# Patient Record
Sex: Male | Born: 1955 | Race: White | Hispanic: No | Marital: Married | Smoking: Current every day smoker
Health system: Southern US, Community
[De-identification: ages and names within clinical notes are randomized; demographics above are authoritative.]

## PROBLEM LIST (undated history)

## (undated) DIAGNOSIS — I10 Essential (primary) hypertension: Secondary | ICD-10-CM

---

## 2015-09-13 ENCOUNTER — Observation Stay: Payer: Self-pay

## 2015-09-13 ENCOUNTER — Emergency Department: Payer: Self-pay

## 2015-09-13 ENCOUNTER — Observation Stay
Admission: EM | Admit: 2015-09-13 | Discharge: 2015-09-14 | Disposition: A | Discharge status: Home / Self Care | Payer: Self-pay | Attending: Internal Medicine | Admitting: Internal Medicine

## 2015-09-13 ENCOUNTER — Inpatient Hospital Stay: Payer: Self-pay

## 2015-09-13 ENCOUNTER — Encounter: Payer: Self-pay | Admitting: Internal Medicine

## 2015-09-13 DIAGNOSIS — I440 Atrioventricular block, first degree: Secondary | ICD-10-CM | POA: Insufficient documentation

## 2015-09-13 DIAGNOSIS — K529 Noninfective gastroenteritis and colitis, unspecified: Secondary | ICD-10-CM | POA: Insufficient documentation

## 2015-09-13 DIAGNOSIS — K5900 Constipation, unspecified: Secondary | ICD-10-CM | POA: Insufficient documentation

## 2015-09-13 DIAGNOSIS — J90 Pleural effusion, not elsewhere classified: Secondary | ICD-10-CM | POA: Insufficient documentation

## 2015-09-13 DIAGNOSIS — I161 Hypertensive emergency: Secondary | ICD-10-CM

## 2015-09-13 DIAGNOSIS — M549 Dorsalgia, unspecified: Secondary | ICD-10-CM

## 2015-09-13 DIAGNOSIS — F17200 Nicotine dependence, unspecified, uncomplicated: Secondary | ICD-10-CM | POA: Insufficient documentation

## 2015-09-13 DIAGNOSIS — I517 Cardiomegaly: Secondary | ICD-10-CM | POA: Insufficient documentation

## 2015-09-13 DIAGNOSIS — I160 Hypertensive urgency: Principal | ICD-10-CM | POA: Diagnosis present

## 2015-09-13 DIAGNOSIS — Z8249 Family history of ischemic heart disease and other diseases of the circulatory system: Secondary | ICD-10-CM | POA: Insufficient documentation

## 2015-09-13 DIAGNOSIS — R7989 Other specified abnormal findings of blood chemistry: Secondary | ICD-10-CM | POA: Insufficient documentation

## 2015-09-13 DIAGNOSIS — R778 Other specified abnormalities of plasma proteins: Secondary | ICD-10-CM | POA: Insufficient documentation

## 2015-09-13 DIAGNOSIS — I4581 Long QT syndrome: Secondary | ICD-10-CM | POA: Insufficient documentation

## 2015-09-13 HISTORY — DX: Essential (primary) hypertension: I10

## 2015-09-13 LAB — COMPREHENSIVE METABOLIC PANEL
ALBUMIN: 3.9 g/dL (ref 3.5–5.0)
ALK PHOS: 80 U/L (ref 38–126)
ALT: 15 U/L — ABNORMAL LOW (ref 17–63)
ANION GAP: 6 (ref 5–15)
AST: 19 U/L (ref 15–41)
BILIRUBIN TOTAL: 1.2 mg/dL (ref 0.3–1.2)
BUN: 23 mg/dL — AB (ref 6–20)
CALCIUM: 8.9 mg/dL (ref 8.9–10.3)
CO2: 22 mmol/L (ref 22–32)
Chloride: 106 mmol/L (ref 101–111)
Creatinine, Ser: 1 mg/dL (ref 0.61–1.24)
GFR calc Af Amer: >60 mL/min (ref >60)
GFR calc non Af Amer: >60 mL/min (ref >60)
GLUCOSE: 106 mg/dL — AB (ref 65–99)
POTASSIUM: 3.3 mmol/L — AB (ref 3.5–5.1)
SODIUM: 134 mmol/L — AB (ref 135–145)
Total Protein: 6.9 g/dL (ref 6.5–8.1)

## 2015-09-13 LAB — CBC
HEMATOCRIT: 29.6 % — AB (ref 40.0–52.0)
HEMOGLOBIN: 9.6 g/dL — AB (ref 13.0–18.0)
MCH: 22.9 pg — ABNORMAL LOW (ref 26.0–34.0)
MCHC: 32.4 g/dL (ref 32.0–36.0)
MCV: 70.6 fL — ABNORMAL LOW (ref 80.0–100.0)
Platelets: 395 10*3/uL (ref 150–440)
RBC: 4.2 MIL/uL — ABNORMAL LOW (ref 4.40–5.90)
RDW: 15.9 % — ABNORMAL HIGH (ref 11.5–14.5)
WBC: 8.9 10*3/uL (ref 3.8–10.6)

## 2015-09-13 LAB — TROPONIN I
TROPONIN I: 0.1 ng/mL — AB (ref <0.031)
Troponin I: 0.12 ng/mL — ABNORMAL HIGH (ref <0.031)
Troponin I: 0.06 ng/mL — ABNORMAL HIGH (ref <0.031)

## 2015-09-13 MED ORDER — LABETALOL HCL 5 MG/ML IV SOLN
10.0000 mg | Freq: Once | INTRAVENOUS | Status: AC
  Administered 2015-09-13: 10 mg via INTRAVENOUS
  Filled 2015-09-13: qty 4

## 2015-09-13 MED ORDER — ONDANSETRON HCL 4 MG PO TABS: 4.0000 mg | ORAL_TABLET | Freq: Four times a day (QID) | ORAL | Status: DC | PRN

## 2015-09-13 MED ORDER — AMLODIPINE BESYLATE 5 MG PO TABS
5.0000 mg | ORAL_TABLET | Freq: Every day | ORAL | Status: DC
  Administered 2015-09-13 – 2015-09-14 (×2): 5 mg via ORAL
  Filled 2015-09-13 (×2): qty 1

## 2015-09-13 MED ORDER — ONDANSETRON HCL 4 MG/2ML IJ SOLN
4.0000 mg | Freq: Four times a day (QID) | INTRAMUSCULAR | Status: DC | PRN
  Administered 2015-09-13 – 2015-09-14 (×2): 4 mg via INTRAVENOUS
  Filled 2015-09-13 (×2): qty 2

## 2015-09-13 MED ORDER — POLYETHYLENE GLYCOL 3350 17 G PO PACK: 17.0000 g | PACK | Freq: Every day | ORAL | Status: DC | PRN

## 2015-09-13 MED ORDER — ACETAMINOPHEN 325 MG PO TABS: 650.0000 mg | ORAL_TABLET | Freq: Four times a day (QID) | ORAL | Status: DC | PRN

## 2015-09-13 MED ORDER — OXYCODONE HCL 5 MG PO TABS: 5.0000 mg | ORAL_TABLET | ORAL | Status: DC | PRN

## 2015-09-13 MED ORDER — CIPROFLOXACIN IN D5W 400 MG/200ML IV SOLN
400.0000 mg | Freq: Two times a day (BID) | INTRAVENOUS | Status: DC
  Administered 2015-09-13 – 2015-09-14 (×3): 400 mg via INTRAVENOUS
  Filled 2015-09-13 (×5): qty 200

## 2015-09-13 MED ORDER — SODIUM CHLORIDE 0.9% FLUSH
3.0000 mL | Freq: Two times a day (BID) | INTRAVENOUS | Status: DC
  Administered 2015-09-13 – 2015-09-14 (×3): 3 mL via INTRAVENOUS

## 2015-09-13 MED ORDER — ACETAMINOPHEN 650 MG RE SUPP: 650.0000 mg | Freq: Four times a day (QID) | RECTAL | Status: DC | PRN

## 2015-09-13 MED ORDER — METRONIDAZOLE IN NACL 5-0.79 MG/ML-% IV SOLN
500.0000 mg | Freq: Three times a day (TID) | INTRAVENOUS | Status: DC
  Administered 2015-09-13 – 2015-09-14 (×4): 500 mg via INTRAVENOUS
  Filled 2015-09-13 (×7): qty 100

## 2015-09-13 MED ORDER — HYDRALAZINE HCL 20 MG/ML IJ SOLN
10.0000 mg | INTRAMUSCULAR | Status: DC | PRN
  Administered 2015-09-13: 10 mg via INTRAVENOUS
  Filled 2015-09-13: qty 1

## 2015-09-13 MED ORDER — MORPHINE SULFATE (PF) 2 MG/ML IV SOLN: 2.0000 mg | INTRAVENOUS | Status: DC | PRN

## 2015-09-13 MED ORDER — DOCUSATE SODIUM 100 MG PO CAPS
100.0000 mg | ORAL_CAPSULE | Freq: Two times a day (BID) | ORAL | Status: DC
  Administered 2015-09-13 – 2015-09-14 (×2): 100 mg via ORAL
  Filled 2015-09-13 (×3): qty 1

## 2015-09-13 MED ORDER — ENOXAPARIN SODIUM 40 MG/0.4ML ~~LOC~~ SOLN
40.0000 mg | SUBCUTANEOUS | Status: DC
  Administered 2015-09-13 – 2015-09-14 (×2): 40 mg via SUBCUTANEOUS
  Filled 2015-09-13 (×2): qty 0.4

## 2015-09-13 MED ORDER — IOPAMIDOL (ISOVUE-370) INJECTION 76%
100.0000 mL | Freq: Once | INTRAVENOUS | Status: AC | PRN
  Administered 2015-09-13: 100 mL via INTRAVENOUS

## 2015-09-13 MED ORDER — NITROGLYCERIN 2 % TD OINT
1.0000 [in_us] | TOPICAL_OINTMENT | Freq: Four times a day (QID) | TRANSDERMAL | Status: DC
  Administered 2015-09-13 – 2015-09-14 (×6): 1 [in_us] via TOPICAL
  Filled 2015-09-13 (×6): qty 1

## 2015-09-13 MED ORDER — NICOTINE 21 MG/24HR TD PT24
21.0000 mg | MEDICATED_PATCH | Freq: Every day | TRANSDERMAL | Status: DC
  Administered 2015-09-13 – 2015-09-14 (×2): 21 mg via TRANSDERMAL
  Filled 2015-09-13 (×2): qty 1

## 2015-09-13 NOTE — ED Notes (Signed)
Pt transported to CT via stretcher. Wife left and will return later in the day.

## 2015-09-13 NOTE — ED Notes (Signed)
Pt states lower back "pressure", states normally has a BM every day but the last week or so he has not been having a BM. Pt states he was having "hot/cold sweats while trying to sleep" Pt states last BM was Sunday after taking a laxative.

## 2015-09-13 NOTE — H&P (Signed)
[facility] - [place] at [facility]   PATIENT NAME: Jose Riley    MR#:  [id number]  DATE OF BIRTH:  [birth year]   DATE OF ADMISSION:  09/13/2015  PRIMARY CARE PHYSICIAN: No PCP Per Patient   REQUESTING/REFERRING PHYSICIAN: brown  CHIEF COMPLAINT:   Back pain, constipation  HISTORY OF PRESENT ILLNESS:  Jose Riley  is a 60 y.o. male with a known history of essential hypertension on no medications secondary to insurance issues presenting to the hospital with back pain described as pressure in intensity 6/10 nonradiating and no worsening or relieving factors. Associated constipation last bowel movement 4-5 days ago. Subjective fevers chills no further symptomatology. Upon arrival to the emergency department noted to be markedly hypertensive systolic blood pressure greater than 200.  PAST MEDICAL HISTORY:   Past Medical History  Diagnosis Date  . Hypertension     PAST SURGICAL HISTORY:  History reviewed. No pertinent past surgical history.  SOCIAL HISTORY:   Social History  Substance Use Topics  . Smoking status: Current Every Day Smoker  . Smokeless tobacco: Not on file  . Alcohol Use: Yes    FAMILY HISTORY:   Family History  Problem Relation Age of Onset  . Hypertension Other     DRUG ALLERGIES:  No Known Allergies  REVIEW OF SYSTEMS:  REVIEW OF SYSTEMS:  CONSTITUTIONAL: Positive fevers, chills, fatigue, weakness.  EYES: Denies blurred vision, double vision, or eye pain.  EARS, NOSE, THROAT: Denies tinnitus, ear pain, hearing loss.  RESPIRATORY: denies cough, shortness of breath, wheezing  CARDIOVASCULAR: Denies chest pain, palpitations, edema.  GASTROINTESTINAL: Denies nausea, vomiting, diarrhea, positive abdominal pain.  GENITOURINARY: Denies dysuria, hematuria.  ENDOCRINE: Denies nocturia or thyroid problems. HEMATOLOGIC AND LYMPHATIC: Denies easy bruising or bleeding.  SKIN: Denies rash or lesions.  MUSCULOSKELETAL: Denies  pain in neck, back, shoulder, knees, hips, or further arthritic symptoms.  NEUROLOGIC: Denies paralysis, paresthesias.  PSYCHIATRIC: Denies anxiety or depressive symptoms. Otherwise full review of systems performed by me is negative.   MEDICATIONS AT HOME:   Prior to Admission medications   Not on File      VITAL SIGNS:  Blood pressure 179/130, pulse 80, temperature 98.7 F (37.1 C), temperature source Oral, resp. rate 23, height  (1.854 m), weight 83.915 kg (185 lb), SpO2 94 %.  PHYSICAL EXAMINATION:  VITAL SIGNS: Filed Vitals:   09/13/15 0730 09/13/15 0800  BP: 176/123 179/130  Pulse: 77 80  Temp:    Resp: 26 23   GENERAL:59 y.o.male currently in no acute distress.  HEAD: Normocephalic, atraumatic.  EYES: Pupils equal, round, reactive to light. Extraocular muscles intact. No scleral icterus.  MOUTH: Moist mucosal membrane. Dentition intact. No abscess noted.  EAR, NOSE, THROAT: Clear without exudates. No external lesions.  NECK: Supple. No thyromegaly. No nodules. No JVD.  PULMONARY: Clear to ascultation, without wheeze rails or rhonci. No use of accessory muscles, Good respiratory effort. good air entry bilaterally CHEST: Nontender to palpation.  CARDIOVASCULAR: S1 and S2. Regular rate and rhythm. No murmurs, rubs, or gallops. No edema. Pedal pulses 2+ bilaterally.  GASTROINTESTINAL: Soft, nontender, nondistended. No masses. Positive bowel sounds. No hepatosplenomegaly.  MUSCULOSKELETAL: No swelling, clubbing, or edema. Range of motion full in all extremities.  NEUROLOGIC: Cranial nerves II through XII are intact. No gross focal neurological deficits. Sensation intact. Reflexes intact.  SKIN: No ulceration, lesions, rashes, or cyanosis. Skin warm and dry. Turgor intact.  PSYCHIATRIC: Mood, affect within normal limits. The patient is  awake, alert and oriented x 3. Insight, judgment intact.    LABORATORY PANEL:   CBC  Recent Labs Lab 09/13/15 0639  WBC 8.9  HGB  9.6*  HCT 29.6*  PLT 395   ------------------------------------------------------------------------------------------------------------------  Chemistries   Recent Labs Lab 09/13/15 0639  NA 134*  K 3.3*  CL 106  CO2 22  GLUCOSE 106*  BUN 23*  CREATININE 1.00  CALCIUM 8.9  AST 19  ALT 15*  ALKPHOS 80  BILITOT 1.2   ------------------------------------------------------------------------------------------------------------------  Cardiac Enzymes  Recent Labs Lab 09/13/15 0639  TROPONINI 0.12*   ------------------------------------------------------------------------------------------------------------------  RADIOLOGY:  Dg Abd 1 View  09/13/2015  CLINICAL DATA:  Chronic constipation, with lower back pressure. Initial encounter. EXAM: ABDOMEN - 1 VIEW COMPARISON:  None. FINDINGS: The visualized bowel gas pattern is unremarkable. Scattered air and stool filled loops of colon are seen; no abnormal dilatation of small bowel loops is seen to suggest small bowel obstruction. No free intra-abdominal air is identified, though evaluation for free air is limited on a single supine view. The visualized osseous structures are within normal limits; the sacroiliac joints are unremarkable in appearance. IMPRESSION: Unremarkable bowel gas pattern; no free intra-abdominal air seen. Small to moderate amount of stool noted in the colon. Electronically Signed   By: Roanna Raider M.D.   On: 09/13/2015 03:06   Ct Angio Abd/pel W/ And/or W/o  09/13/2015  CLINICAL DATA:  Acute onset of lower back pressure. Lack of bowel movements. Severely elevated blood pressure. Initial encounter. EXAM: CTA ABDOMEN AND PELVIS wITHOUT AND WITH CONTRAST TECHNIQUE: Multidetector CT imaging of the abdomen and pelvis was performed using the standard protocol during bolus administration of intravenous contrast. Multiplanar reconstructed images and MIPs were obtained and reviewed to evaluate the vascular anatomy.  CONTRAST:  100 mL of Omnipaque 300 IV contrast COMPARISON:  Abdominal radiograph performed earlier today at 2:27 a.m. FINDINGS: Mildly loculated small bilateral pleural effusions are seen, left greater than right, with mild interstitial prominence and mild left basilar opacity. This may reflect mild pulmonary edema. The heart is enlarged, with biatrial enlargement. There is no evidence of aortic dissection. There is no evidence of aneurysmal dilatation. The celiac trunk, superior mesenteric artery, bilateral renal arteries and inferior mesenteric artery appear patent. The liver and spleen are unremarkable in appearance. The gallbladder is within normal limits. The pancreas and adrenal glands are unremarkable. The kidneys are unremarkable in appearance. There is no evidence of hydronephrosis. No renal or ureteral stones are seen. Mild nonspecific perinephric stranding is noted bilaterally. No free fluid is identified. The small bowel is unremarkable in appearance. The stomach is within normal limits. No acute vascular abnormalities are seen. There is ectasia of the infrarenal abdominal aorta, measuring 2.8 cm in AP dimension, without evidence of aneurysmal dilatation. Mild calcification is noted along the abdominal aorta and its branches. The appendix is normal in caliber, without evidence of appendicitis. Mild wall thickening is suggested along the descending and proximal sigmoid colon, concerning for mild infectious or inflammatory colitis. The bladder is mildly distended and grossly unremarkable. The prostate is borderline normal in size. No inguinal lymphadenopathy is seen. No acute osseous abnormalities are identified. Anterior osteophytes are noted along the lower thoracic and lumbar spine. Review of the MIP images confirms the above findings. IMPRESSION: 1. Mild wall thickening suggest along the descending and proximal sigmoid colon, concerning for mild infectious or inflammatory colitis. 2. Mildly loculated  small bilateral pleural effusions, left greater than right, with mild interstitial prominence  and mild left basilar opacity. This is concerning for mild pulmonary edema. 3. Cardiomegaly, with biatrial enlargement. 4. No evidence of aortic dissection. No evidence of aneurysmal dilatation. Ectasia of the infrarenal abdominal aorta. Mild calcification along the abdominal aorta and its branches. Electronically Signed   By: Roanna RaiderJeffery  Chang M.D.   On: 09/13/2015 07:30    EKG:   Orders placed or performed during the hospital encounter of 09/13/15  . EKG 12-Lead  . EKG 12-Lead    IMPRESSION AND PLAN:   60 year old Caucasian gentleman history of essential hypertension not on current medications secondary to insurance issues admitted 09/13/2015 with colitis and hypertensive urgency  1. Hypertensive urgency: Will start Norvasc,, as needed hydralazine, will place nitroglycerin paste 2. Colitis: Unspecified, we'll start Cipro Flagyl for about a coverage can transition to oral 1 tolerates oral diet 3. Elevated troponin likely setting of demand ischemia: Telemetry trend cardiac enzymes 4. Venous thrombus embolism prophylactic: Lovenox    All the records are reviewed and case discussed with ED provider. Management plans discussed with the patient, family and they are in agreement.  CODE STATUS: Full  TOTAL TIME TAKING CARE OF THIS PATIENT: 33 minutes.    Hisae Decoursey,  Mardi MainlandDavid K M.D on 09/13/2015 at 9:50 AM  Between 7am to 6pm - Pager - [phone]  After 6pm: House Pager: [phone]  [facility]  Office  [phone]  CC: Primary care physician; No PCP Per Patient

## 2015-09-13 NOTE — ED Provider Notes (Signed)
[facility] Emergency Department Provider Note  ____________________________________________  Time seen: 5:45 AM  I have reviewed the triage vital signs and the nursing notes.   HISTORY  Chief Complaint Constipation      HPI Jose Riley is a 60 y.o. male with no past medical history presents to the emergency department with constipation 4 days as well as low back pain. Patient states that he normally goes to the bathroom approximately 2-3 times a day that he does not have an issue with constipation however starting last week he's been constipated. Patient states that he took a laxative on Sunday and had a normal bowel movement. Patient however states that he has not had a bowel movement since that point patient denies any fever. Patient noted to be markedly hypertensive 207/163 and arrival to the treatment room.     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   Past surgical history Atrial septal defect repair No current outpatient prescriptions on file.  Allergies No known drug allergies No family history on file.  Social History Social History  Substance Use Topics  . Smoking status: Not on file  . Smokeless tobacco: Not on file  . Alcohol Use: Not on file    Review of Systems  Constitutional: Negative for fever. Eyes: Negative for visual changes. ENT: Negative for sore throat. Cardiovascular: Negative for chest pain. Respiratory: Negative for shortness of breath. Gastrointestinal: Negative for abdominal pain, vomiting and diarrhea. Genitourinary: Negative for dysuria. Musculoskeletal: Positive for back pain. Skin: Negative for rash. Neurological: Negative for headaches, focal weakness or numbness.  10-point ROS otherwise negative.  ____________________________________________   PHYSICAL EXAM:  VITAL SIGNS: ED Triage Vitals  Enc Vitals Group     BP 09/13/15  0203 165/100 mmHg     Pulse Rate 09/13/15 0203 100     Resp 09/13/15 0203 18     Temp 09/13/15 0203 98.7 F (37.1 C)     Temp Source 09/13/15 0203 Oral     SpO2 09/13/15 0203 97 %     Weight 09/13/15 0202 185 lb (83.915 kg)     Height 09/13/15 0202  (1.854 m)     Head Cir --      Peak Flow --      Pain Score 09/13/15 0202 2     Pain Loc --      Pain Edu? --      Excl. in GC? --     Constitutional: Alert and oriented. Well appearing and in no distress. Eyes: Conjunctivae are normal. PERRL. Normal extraocular movements. ENT   Head: Normocephalic and atraumatic.   Nose: No congestion/rhinnorhea.   Mouth/Throat: Mucous membranes are moist.   Neck: No stridor. Hematological/Lymphatic/Immunilogical: No cervical lymphadenopathy. Cardiovascular: Normal rate, regular rhythm. Normal and symmetric distal pulses are present in all extremities. No murmurs, rubs, or gallops. Respiratory: Normal respiratory effort without tachypnea nor retractions. Breath sounds are clear and equal bilaterally. No wheezes/rales/rhonchi. Gastrointestinal: Soft and nontender. No distention. There is no CVA tenderness. Genitourinary: deferred Musculoskeletal: Nontender with normal range of motion in all extremities. No joint effusions.  No lower extremity tenderness nor edema. Neurologic:  Normal speech and language. No gross focal neurologic deficits are appreciated. Speech is normal.  Skin:  Skin is warm, dry and intact. No rash noted. Psychiatric: Mood and affect are normal. Speech and behavior are normal. Patient exhibits appropriate insight and judgment.  ____________________________________________  LABS (pertinent positives/negatives)  Labs Reviewed  CBC - Abnormal; Notable for the following:    RBC 4.20 (*)    Hemoglobin 9.6 (*)    HCT 29.6 (*)    MCV 70.6 (*)    MCH 22.9 (*)    RDW 15.9 (*)    All other components within normal limits  COMPREHENSIVE METABOLIC PANEL -  Abnormal; Notable for the following:    Sodium 134 (*)    Potassium 3.3 (*)    Glucose, Bld 106 (*)    BUN 23 (*)    ALT 15 (*)    All other components within normal limits  TROPONIN I - Abnormal; Notable for the following:    Troponin I 0.12 (*)    All other components within normal limits  TROPONIN I - Abnormal; Notable for the following:    Troponin I 0.10 (*)    All other components within normal limits  TROPONIN I - Abnormal; Notable for the following:    Troponin I 0.06 (*)    All other components within normal limits  TROPONIN I - Abnormal; Notable for the following:    Troponin I 0.06 (*)    All other components within normal limits  BASIC METABOLIC PANEL - Abnormal; Notable for the following:    Sodium 134 (*)    Potassium 3.4 (*)    Glucose, Bld 102 (*)    Calcium 8.5 (*)    All other components within normal limits  CBC - Abnormal; Notable for the following:    RBC 3.72 (*)    Hemoglobin 8.5 (*)    HCT 26.8 (*)    MCV 71.9 (*)    MCH 22.7 (*)    MCHC 31.6 (*)    RDW 16.3 (*)    All other components within normal limits     ____________________________________________   EKG  ED ECG REPORT I, [place] [facility], the attending physician, personally viewed and interpreted this ECG.   Date: 09/14/2015  EKG Time: 7:22 AM  Rate: 77  Rhythm: Normal sinus rhythm with LVH  Axis: Left axis deviation  Intervals: Normal  ST&T Change: V5 V6 T-wave inversion   ____________________________________________    RADIOLOGY DG Chest 2 View (Final result) Result time: 09/13/15 10:06:24   Final result by Rad Results In Interface (09/13/15 10:06:24)   Narrative:   CLINICAL DATA: 60 year old male with severe hypertension, greater than 200 systolic. Pain radiating to the back. Initial encounter.  EXAM: CHEST 2 VIEW  COMPARISON: CTA abdomen and pelvis 0628 hours today.  FINDINGS: Cardiomegaly. Mildly tortuous thoracic aorta. Other mediastinal contours are  within normal limits. Visualized tracheal air column is within normal limits. Small left greater than right pleural effusions re- demonstrated. Increased interstitial markings in the chest, basilar predominant. No pneumothorax. No consolidation. Widespread bulky spinal endplate spurring. No acute osseous abnormality identified.  IMPRESSION: Cardiomegaly with small left greater than right pleural effusions and increased interstitial opacity suspicious for mild interstitial edema in this setting. Chronic interstitial pulmonary changes and viral/atypical infection are the main differential considerations.   Electronically Signed By: Odessa FlemingH Hall M.D. On: 09/13/2015 10:06          CT Angio Abd/Pel w/ and/or w/o (Final result) Result time: 09/13/15 07:30:27   Procedure changed from CT Abdomen Pelvis W Contrast      Final result by Rad Results In Interface (09/13/15 07:30:27)   Narrative:   CLINICAL DATA: Acute onset of lower back pressure. Lack of bowel movements. Severely  elevated blood pressure. Initial encounter.  EXAM: CTA ABDOMEN AND PELVIS wITHOUT AND WITH CONTRAST  TECHNIQUE: Multidetector CT imaging of the abdomen and pelvis was performed using the standard protocol during bolus administration of intravenous contrast. Multiplanar reconstructed images and MIPs were obtained and reviewed to evaluate the vascular anatomy.  CONTRAST: 100 mL of Omnipaque 300 IV contrast  COMPARISON: Abdominal radiograph performed earlier today at 2:27 a.m.  FINDINGS: Mildly loculated small bilateral pleural effusions are seen, left greater than right, with mild interstitial prominence and mild left basilar opacity. This may reflect mild pulmonary edema. The heart is enlarged, with biatrial enlargement.  There is no evidence of aortic dissection. There is no evidence of aneurysmal dilatation. The celiac trunk, superior mesenteric artery, bilateral renal arteries and inferior  mesenteric artery appear patent.  The liver and spleen are unremarkable in appearance. The gallbladder is within normal limits. The pancreas and adrenal glands are unremarkable.  The kidneys are unremarkable in appearance. There is no evidence of hydronephrosis. No renal or ureteral stones are seen. Mild nonspecific perinephric stranding is noted bilaterally.  No free fluid is identified. The small bowel is unremarkable in appearance. The stomach is within normal limits. No acute vascular abnormalities are seen. There is ectasia of the infrarenal abdominal aorta, measuring 2.8 cm in AP dimension, without evidence of aneurysmal dilatation. Mild calcification is noted along the abdominal aorta and its branches.  The appendix is normal in caliber, without evidence of appendicitis. Mild wall thickening is suggested along the descending and proximal sigmoid colon, concerning for mild infectious or inflammatory colitis.  The bladder is mildly distended and grossly unremarkable. The prostate is borderline normal in size. No inguinal lymphadenopathy is seen.  No acute osseous abnormalities are identified. Anterior osteophytes are noted along the lower thoracic and lumbar spine.  Review of the MIP images confirms the above findings.  IMPRESSION: 1. Mild wall thickening suggest along the descending and proximal sigmoid colon, concerning for mild infectious or inflammatory colitis. 2. Mildly loculated small bilateral pleural effusions, left greater than right, with mild interstitial prominence and mild left basilar opacity. This is concerning for mild pulmonary edema. 3. Cardiomegaly, with biatrial enlargement. 4. No evidence of aortic dissection. No evidence of aneurysmal dilatation. Ectasia of the infrarenal abdominal aorta. Mild calcification along the abdominal aorta and its branches.   Electronically Signed By: Roanna Raider M.D. On: 09/13/2015 07:30          DG  Abd 1 View (Final result) Result time: 09/13/15 03:06:58   Final result by Rad Results In Interface (09/13/15 03:06:58)   Narrative:   CLINICAL DATA: Chronic constipation, with lower back pressure. Initial encounter.  EXAM: ABDOMEN - 1 VIEW  COMPARISON: None.  FINDINGS: The visualized bowel gas pattern is unremarkable. Scattered air and stool filled loops of colon are seen; no abnormal dilatation of small bowel loops is seen to suggest small bowel obstruction. No free intra-abdominal air is identified, though evaluation for free air is limited on a single supine view.  The visualized osseous structures are within normal limits; the sacroiliac joints are unremarkable in appearance.  IMPRESSION: Unremarkable bowel gas pattern; no free intra-abdominal air seen. Small to moderate amount of stool noted in the colon.   Electronically Signed By: Roanna Raider M.D. On: 09/13/2015 03:06         INITIAL IMPRESSION / ASSESSMENT AND PLAN / ED COURSE  Pertinent labs & imaging results that were available during my care of the patient were reviewed by me  and considered in my medical decision making (see chart for details).  Patient markedly hypertensive on presentation the emergency department which resulted in before mentioned studies performed which revealed left ventricular hypertrophy elevated troponin concern for possible demand ischemia. As a result patient discussed with Dr. Betti Cruz for hospital admission for further evaluation and management  ____________________________________________   FINAL CLINICAL IMPRESSION(S) / ED DIAGNOSES  Final diagnoses:  Elevated troponin  LVH (left ventricular hypertrophy)  Hypertensive emergency      Darci Current, MD 09/14/15 [phone]

## 2015-09-13 NOTE — ED Notes (Signed)
Pt denies hx HTN. BP taken on both arms, elevated multiple times. Pt denies headache, blurred vision, dizziness.

## 2015-09-13 NOTE — Progress Notes (Signed)
bp 157/115 hydralizine 10 mg administer via iv, 4 mg zofran administer for nauisea

## 2015-09-13 NOTE — Progress Notes (Signed)
Patient admitted to unit from ed, to room 258, patient is 60 y/o male alert and oriented, denies pain or discomfort. Patient ambulate to bathroom independently, family at bedside. Admission assessment  Completed, NSR on the monitor, patient oriented to unit and call light

## 2015-09-13 NOTE — ED Notes (Signed)
Patient ambulatory to triage with steady gait, without difficulty or distress noted; pt reports tooks laxative Sunday pm for "chronic constipation" with relief; tonight having lower back "pressure" and urge to have BM; denies abd pain, denies nausea/vomiting

## 2015-09-14 LAB — CBC
HEMATOCRIT: 26.8 % — AB (ref 40.0–52.0)
HEMOGLOBIN: 8.5 g/dL — AB (ref 13.0–18.0)
MCH: 22.7 pg — AB (ref 26.0–34.0)
MCHC: 31.6 g/dL — AB (ref 32.0–36.0)
MCV: 71.9 fL — AB (ref 80.0–100.0)
Platelets: 320 10*3/uL (ref 150–440)
RBC: 3.72 MIL/uL — ABNORMAL LOW (ref 4.40–5.90)
RDW: 16.3 % — AB (ref 11.5–14.5)
WBC: 8 10*3/uL (ref 3.8–10.6)

## 2015-09-14 LAB — BASIC METABOLIC PANEL
ANION GAP: 7 (ref 5–15)
BUN: 18 mg/dL (ref 6–20)
CO2: 22 mmol/L (ref 22–32)
Calcium: 8.5 mg/dL — ABNORMAL LOW (ref 8.9–10.3)
Chloride: 105 mmol/L (ref 101–111)
Creatinine, Ser: 0.94 mg/dL (ref 0.61–1.24)
GFR calc Af Amer: >60 mL/min (ref >60)
GFR calc non Af Amer: >60 mL/min (ref >60)
GLUCOSE: 102 mg/dL — AB (ref 65–99)
POTASSIUM: 3.4 mmol/L — AB (ref 3.5–5.1)
Sodium: 134 mmol/L — ABNORMAL LOW (ref 135–145)

## 2015-09-14 LAB — TROPONIN I: TROPONIN I: 0.06 ng/mL — AB (ref <0.031)

## 2015-09-14 MED ORDER — AMLODIPINE BESYLATE 5 MG PO TABS: 5.0000 mg | ORAL_TABLET | Freq: Every day | ORAL | Status: DC

## 2015-09-14 MED ORDER — AMLODIPINE BESYLATE 5 MG PO TABS
5.0000 mg | ORAL_TABLET | Freq: Once | ORAL | Status: AC
  Administered 2015-09-14: 5 mg via ORAL
  Filled 2015-09-14: qty 1

## 2015-09-14 MED ORDER — AMLODIPINE BESYLATE 10 MG PO TABS: 10.0000 mg | ORAL_TABLET | Freq: Every day | ORAL | Status: DC

## 2015-09-14 MED ORDER — METRONIDAZOLE 500 MG PO TABS: 500.0000 mg | ORAL_TABLET | Freq: Three times a day (TID) | ORAL | Status: AC

## 2015-09-14 MED ORDER — CIPROFLOXACIN HCL 500 MG PO TABS: 500.0000 mg | ORAL_TABLET | Freq: Two times a day (BID) | ORAL | Status: AC

## 2015-09-14 NOTE — Care Management (Signed)
Patient placed in observation for blood pressure issues.  Patient does not have insurance.  [place] [facility]  Provided patient's wife with coupons for norvasc, and flagyl.  Cipro is on the [employer] four dollar list.  Wife says will be able to pay for meds and does not require referral to [facility]

## 2015-09-14 NOTE — Progress Notes (Signed)
MD notified. PTs DBP is >100 after additional 5mg  of norvasc. Orders to proceed with discharge. Pt will discharge via wheelchair, family at side. Discharge information will be reviewed with patient.

## 2015-09-14 NOTE — Discharge Summary (Addendum)
[facility] - [place] at [facility]   PATIENT NAME: Jose Riley    MR#:  [id number]  DATE OF BIRTH:  07-18-1955  DATE OF ADMISSION:  09/13/2015 ADMITTING PHYSICIAN: Wyatt Haste, MD  DATE OF DISCHARGE: 09/14/2015  PRIMARY CARE PHYSICIAN: No PCP Per Patient    ADMISSION DIAGNOSIS:  Back pain [M54.9] LVH (left ventricular hypertrophy) [I51.7] Elevated troponin [R79.89] Hypertensive emergency [I16.1]  DISCHARGE DIAGNOSIS:  Principal Problem:   Hypertensive urgency Colitis, unspecified Elevated troponin-demand  SECONDARY DIAGNOSIS:   Past Medical History  Diagnosis Date  . Hypertension     HOSPITAL COURSE:  Jose Riley  is a 60 y.o. male admitted 09/13/2015 with chief complaint Constipation . Please see H&P performed by Wyatt Haste, MD for further information. Patient presented with above symptoms found to be markedly hypertensive on arrival. Systolic blood pressure greater than 200. Has been started on new medications for blood pressure with improvement. On imaging he is noted to have evidence of colitis and started on Cipro Flagyl his "pressure" in his abdomen has also improved on day of discharge he was improved and stable  DISCHARGE CONDITIONS:   Stable/improved  CONSULTS OBTAINED:     DRUG ALLERGIES:  No Known Allergies  DISCHARGE MEDICATIONS:   Current Discharge Medication List    START taking these medications   Details  amLODipine (NORVASC) 10 MG tablet Take 1 tablet (10 mg total) by mouth daily. Qty: 30 tablet, Refills: 0    ciprofloxacin (CIPRO) 500 MG tablet Take 1 tablet (500 mg total) by mouth 2 (two) times daily. Qty: 8 tablet, Refills: 0    metroNIDAZOLE (FLAGYL) 500 MG tablet Take 1 tablet (500 mg total) by mouth 3 (three) times daily. Qty: 12 tablet, Refills: 0         DISCHARGE INSTRUCTIONS:    DIET:  Cardiac diet  DISCHARGE CONDITION:  Good  ACTIVITY:  Activity as tolerated  OXYGEN:  Home  Oxygen: No.   Oxygen Delivery: room air  DISCHARGE LOCATION:  home   If you experience worsening of your admission symptoms, develop shortness of breath, life threatening emergency, suicidal or homicidal thoughts you must seek medical attention immediately by calling 911 or calling your MD immediately  if symptoms less severe.  You Must read complete instructions/literature along with all the possible adverse reactions/side effects for all the Medicines you take and that have been prescribed to you. Take any new Medicines after you have completely understood and accpet all the possible adverse reactions/side effects.   Please note  You were cared for by a hospitalist during your hospital stay. If you have any questions about your discharge medications or the care you received while you were in the hospital after you are discharged, you can call the unit and asked to speak with the hospitalist on call if the hospitalist that took care of you is not available. Once you are discharged, your primary care physician will handle any further medical issues. Please note that NO REFILLS for any discharge medications will be authorized once you are discharged, as it is imperative that you return to your primary care physician (or establish a relationship with a primary care physician if you do not have one) for your aftercare needs so that they can reassess your need for medications and monitor your lab values.    On the day of Discharge:   VITAL SIGNS:  Blood pressure 126/109, pulse 79, temperature 98.4 F (36.9 C), temperature source Oral,  resp. rate 20, height 6\' 1"  (1.854 m), weight 82.736 kg (182 lb 6.4 oz), SpO2 97 %.  I/O:   Intake/Output Summary (Last 24 hours) at 09/14/15 1341 Last data filed at 09/14/15 0432  Gross per 24 hour  Intake      0 ml  Output      1 ml  Net     -1 ml    PHYSICAL EXAMINATION:  GENERAL:  60 y.o.-year-old patient lying in the bed with no acute distress.    EYES: Pupils equal, round, reactive to light and accommodation. No scleral icterus. Extraocular muscles intact.  HEENT: Head atraumatic, normocephalic. Oropharynx and nasopharynx clear.  NECK:  Supple, no jugular venous distention. No thyroid enlargement, no tenderness.  LUNGS: Normal breath sounds bilaterally, no wheezing, rales,rhonchi or crepitation. No use of accessory muscles of respiration.  CARDIOVASCULAR: S1, S2 normal. No murmurs, rubs, or gallops.  ABDOMEN: Soft, non-tender, non-distended. Bowel sounds present. No organomegaly or mass.  EXTREMITIES: No pedal edema, cyanosis, or clubbing.  NEUROLOGIC: Cranial nerves II through XII are intact. Muscle strength 5/5 in all extremities. Sensation intact. Gait not checked.  PSYCHIATRIC: The patient is alert and oriented x 3.  SKIN: No obvious rash, lesion, or ulcer.   DATA REVIEW:   CBC  Recent Labs Lab 09/14/15 0340  WBC 8.0  HGB 8.5*  HCT 26.8*  PLT 320    Chemistries   Recent Labs Lab 09/13/15 0639 09/14/15 0340  NA 134* 134*  K 3.3* 3.4*  CL 106 105  CO2 22 22  GLUCOSE 106* 102*  BUN 23* 18  CREATININE 1.00 0.94  CALCIUM 8.9 8.5*  AST 19  --   ALT 15*  --   ALKPHOS 80  --   BILITOT 1.2  --     Cardiac Enzymes  Recent Labs Lab 09/14/15 0340  TROPONINI 0.06*    Microbiology Results  No results found for this or any previous visit.  RADIOLOGY:  Dg Chest 2 View  09/13/2015  CLINICAL DATA:  60 year old male with severe hypertension, greater than 200 systolic. Pain radiating to the back. Initial encounter. EXAM: CHEST  2 VIEW COMPARISON:  CTA abdomen and pelvis 0628 hours today. FINDINGS: Cardiomegaly. Mildly tortuous thoracic aorta. Other mediastinal contours are within normal limits. Visualized tracheal air column is within normal limits. Small left greater than right pleural effusions re- demonstrated. Increased interstitial markings in the chest, basilar predominant. No pneumothorax. No consolidation.  Widespread bulky spinal endplate spurring. No acute osseous abnormality identified. IMPRESSION: Cardiomegaly with small left greater than right pleural effusions and increased interstitial opacity suspicious for mild interstitial edema in this setting. Chronic interstitial pulmonary changes and viral/atypical infection are the main differential considerations. Electronically Signed   By: Odessa FlemingH  Hall M.D.   On: 09/13/2015 10:06   Dg Abd 1 View  09/13/2015  CLINICAL DATA:  Chronic constipation, with lower back pressure. Initial encounter. EXAM: ABDOMEN - 1 VIEW COMPARISON:  None. FINDINGS: The visualized bowel gas pattern is unremarkable. Scattered air and stool filled loops of colon are seen; no abnormal dilatation of small bowel loops is seen to suggest small bowel obstruction. No free intra-abdominal air is identified, though evaluation for free air is limited on a single supine view. The visualized osseous structures are within normal limits; the sacroiliac joints are unremarkable in appearance. IMPRESSION: Unremarkable bowel gas pattern; no free intra-abdominal air seen. Small to moderate amount of stool noted in the colon. Electronically Signed   By: Roanna RaiderJeffery  Chang  M.D.   On: 09/13/2015 03:06   Ct Angio Abd/pel W/ And/or W/o  09/13/2015  CLINICAL DATA:  Acute onset of lower back pressure. Lack of bowel movements. Severely elevated blood pressure. Initial encounter. EXAM: CTA ABDOMEN AND PELVIS wITHOUT AND WITH CONTRAST TECHNIQUE: Multidetector CT imaging of the abdomen and pelvis was performed using the standard protocol during bolus administration of intravenous contrast. Multiplanar reconstructed images and MIPs were obtained and reviewed to evaluate the vascular anatomy. CONTRAST:  100 mL of Omnipaque 300 IV contrast COMPARISON:  Abdominal radiograph performed earlier today at 2:27 a.m. FINDINGS: Mildly loculated small bilateral pleural effusions are seen, left greater than right, with mild interstitial  prominence and mild left basilar opacity. This may reflect mild pulmonary edema. The heart is enlarged, with biatrial enlargement. There is no evidence of aortic dissection. There is no evidence of aneurysmal dilatation. The celiac trunk, superior mesenteric artery, bilateral renal arteries and inferior mesenteric artery appear patent. The liver and spleen are unremarkable in appearance. The gallbladder is within normal limits. The pancreas and adrenal glands are unremarkable. The kidneys are unremarkable in appearance. There is no evidence of hydronephrosis. No renal or ureteral stones are seen. Mild nonspecific perinephric stranding is noted bilaterally. No free fluid is identified. The small bowel is unremarkable in appearance. The stomach is within normal limits. No acute vascular abnormalities are seen. There is ectasia of the infrarenal abdominal aorta, measuring 2.8 cm in AP dimension, without evidence of aneurysmal dilatation. Mild calcification is noted along the abdominal aorta and its branches. The appendix is normal in caliber, without evidence of appendicitis. Mild wall thickening is suggested along the descending and proximal sigmoid colon, concerning for mild infectious or inflammatory colitis. The bladder is mildly distended and grossly unremarkable. The prostate is borderline normal in size. No inguinal lymphadenopathy is seen. No acute osseous abnormalities are identified. Anterior osteophytes are noted along the lower thoracic and lumbar spine. Review of the MIP images confirms the above findings. IMPRESSION: 1. Mild wall thickening suggest along the descending and proximal sigmoid colon, concerning for mild infectious or inflammatory colitis. 2. Mildly loculated small bilateral pleural effusions, left greater than right, with mild interstitial prominence and mild left basilar opacity. This is concerning for mild pulmonary edema. 3. Cardiomegaly, with biatrial enlargement. 4. No evidence of aortic  dissection. No evidence of aneurysmal dilatation. Ectasia of the infrarenal abdominal aorta. Mild calcification along the abdominal aorta and its branches. Electronically Signed   By: Roanna Raider M.D.   On: 09/13/2015 07:30     Management plans discussed with the patient, family and they are in agreement.  CODE STATUS:     Code Status Orders        Start     Ordered   09/13/15 0809  Full code   Continuous     09/13/15 0809    Code Status History    Date Active Date Inactive Code Status Order ID Comments User Context   This patient has a current code status but no historical code status.      TOTAL TIME TAKING CARE OF THIS PATIENT: 28 minutes.    Karisma Meiser,  Mardi [place].D on 09/14/2015 at 1:41 PM  Between 7am to 6pm - Pager - [phone]  After 6pm go to [email or web address] - password [email or web address] [facility]  [place] [facility]  Office  [phone]  CC: Primary care physician; No PCP Per Patient

## 2015-09-14 NOTE — Progress Notes (Signed)
Pts BP is still elevated. MD notified. Norvasc dose increased. Will monitor patient for 1 hour. Recheck BP and discharge patient home per MD order. I will continue to assess.

## 2015-10-08 MED ORDER — AMLODIPINE BESYLATE 10 MG PO TABS: 10.0000 mg | ORAL_TABLET | Freq: Every day | ORAL | Status: AC

## 2016-08-06 IMAGING — CR DG ABDOMEN 1V
1 series · 1 of 1 positions shown · non-contrast
Comparison: None.

CLINICAL DATA: Chronic constipation, with lower back pressure.
Initial encounter.

EXAM:
ABDOMEN - 1 VIEW

[t abdomen supine]
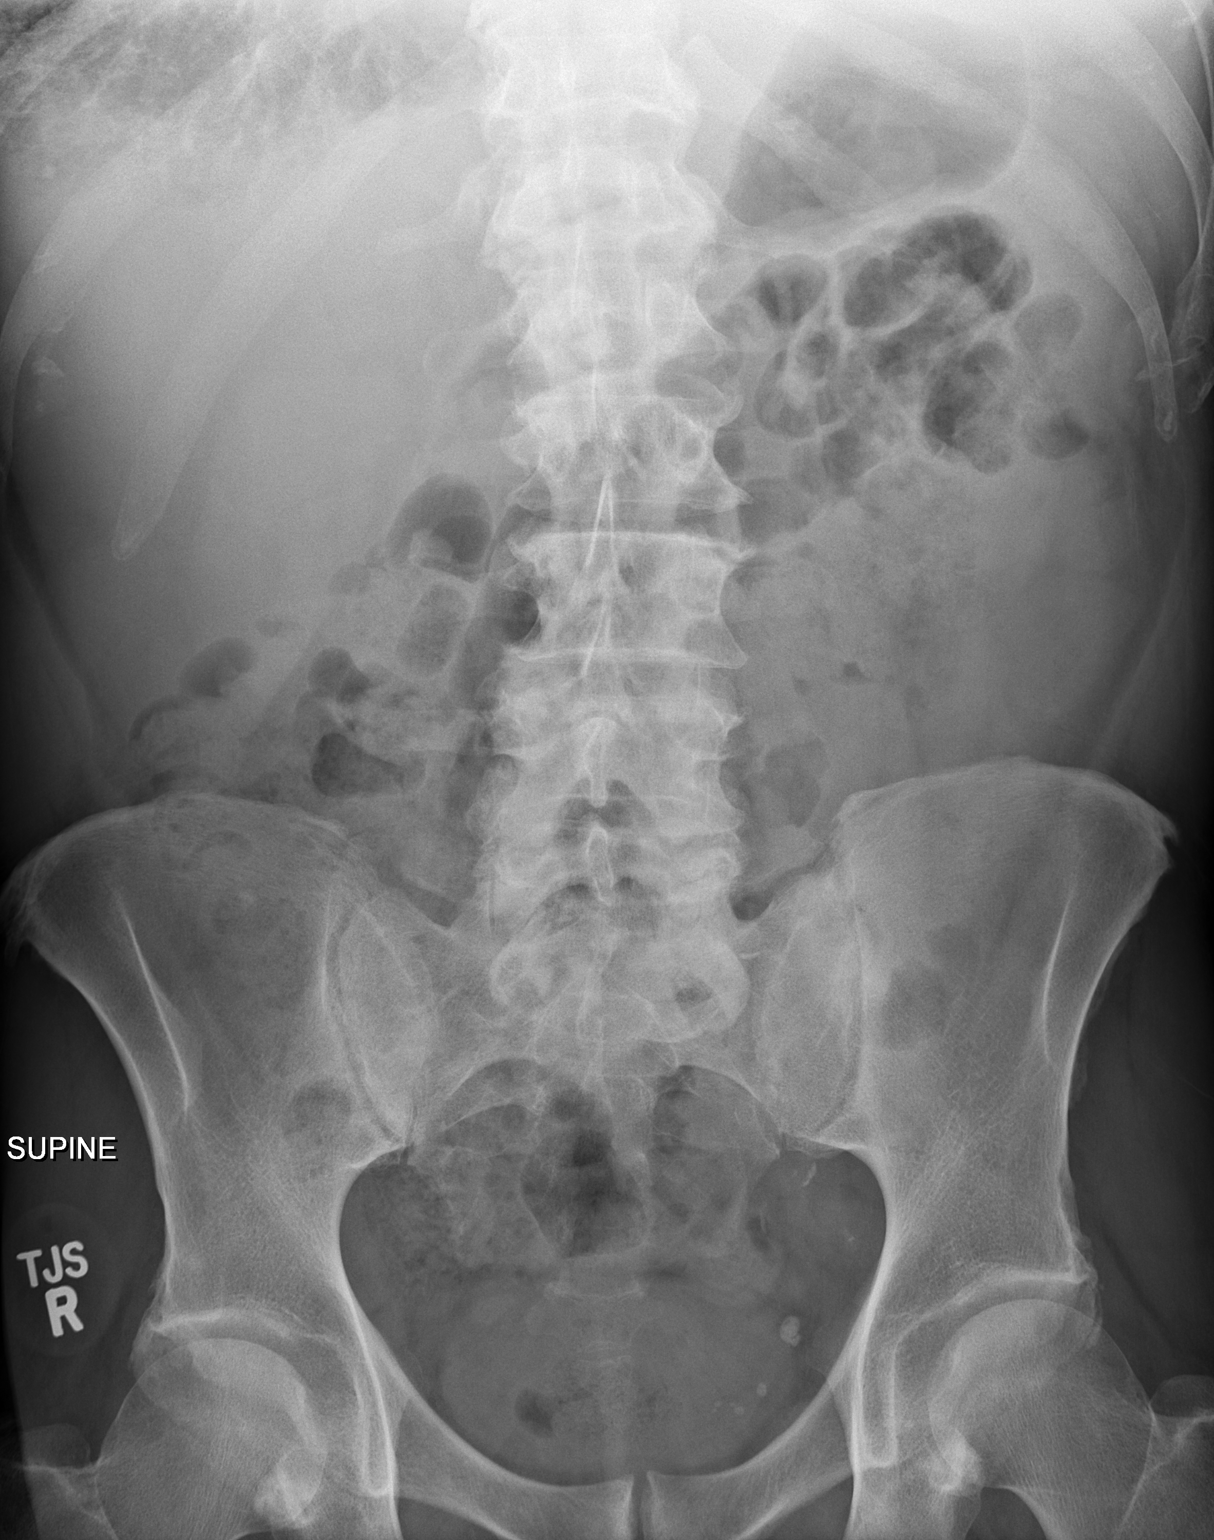

[1 of 1 positions shown; findings below may reference images not displayed]

FINDINGS: The visualized bowel gas pattern is unremarkable. Scattered air and
stool filled loops of colon are seen; no abnormal dilatation of
small bowel loops is seen to suggest small bowel obstruction. No
free intra-abdominal air is identified, though evaluation for free
air is limited on a single supine view.

The visualized osseous structures are within normal limits; the
sacroiliac joints are unremarkable in appearance.
IMPRESSION: Unremarkable bowel gas pattern; no free intra-abdominal air seen.
Small to moderate amount of stool noted in the colon.

## 2016-08-06 IMAGING — CR DG CHEST 2V
2 series · 2 of 2 positions shown · non-contrast
Comparison: CTA abdomen and pelvis 2115 hours today.

CLINICAL DATA: 59-year-old male with severe hypertension, greater
than 200 systolic. Pain radiating to the back. Initial encounter.

EXAM:
CHEST  2 VIEW

[chest pa]
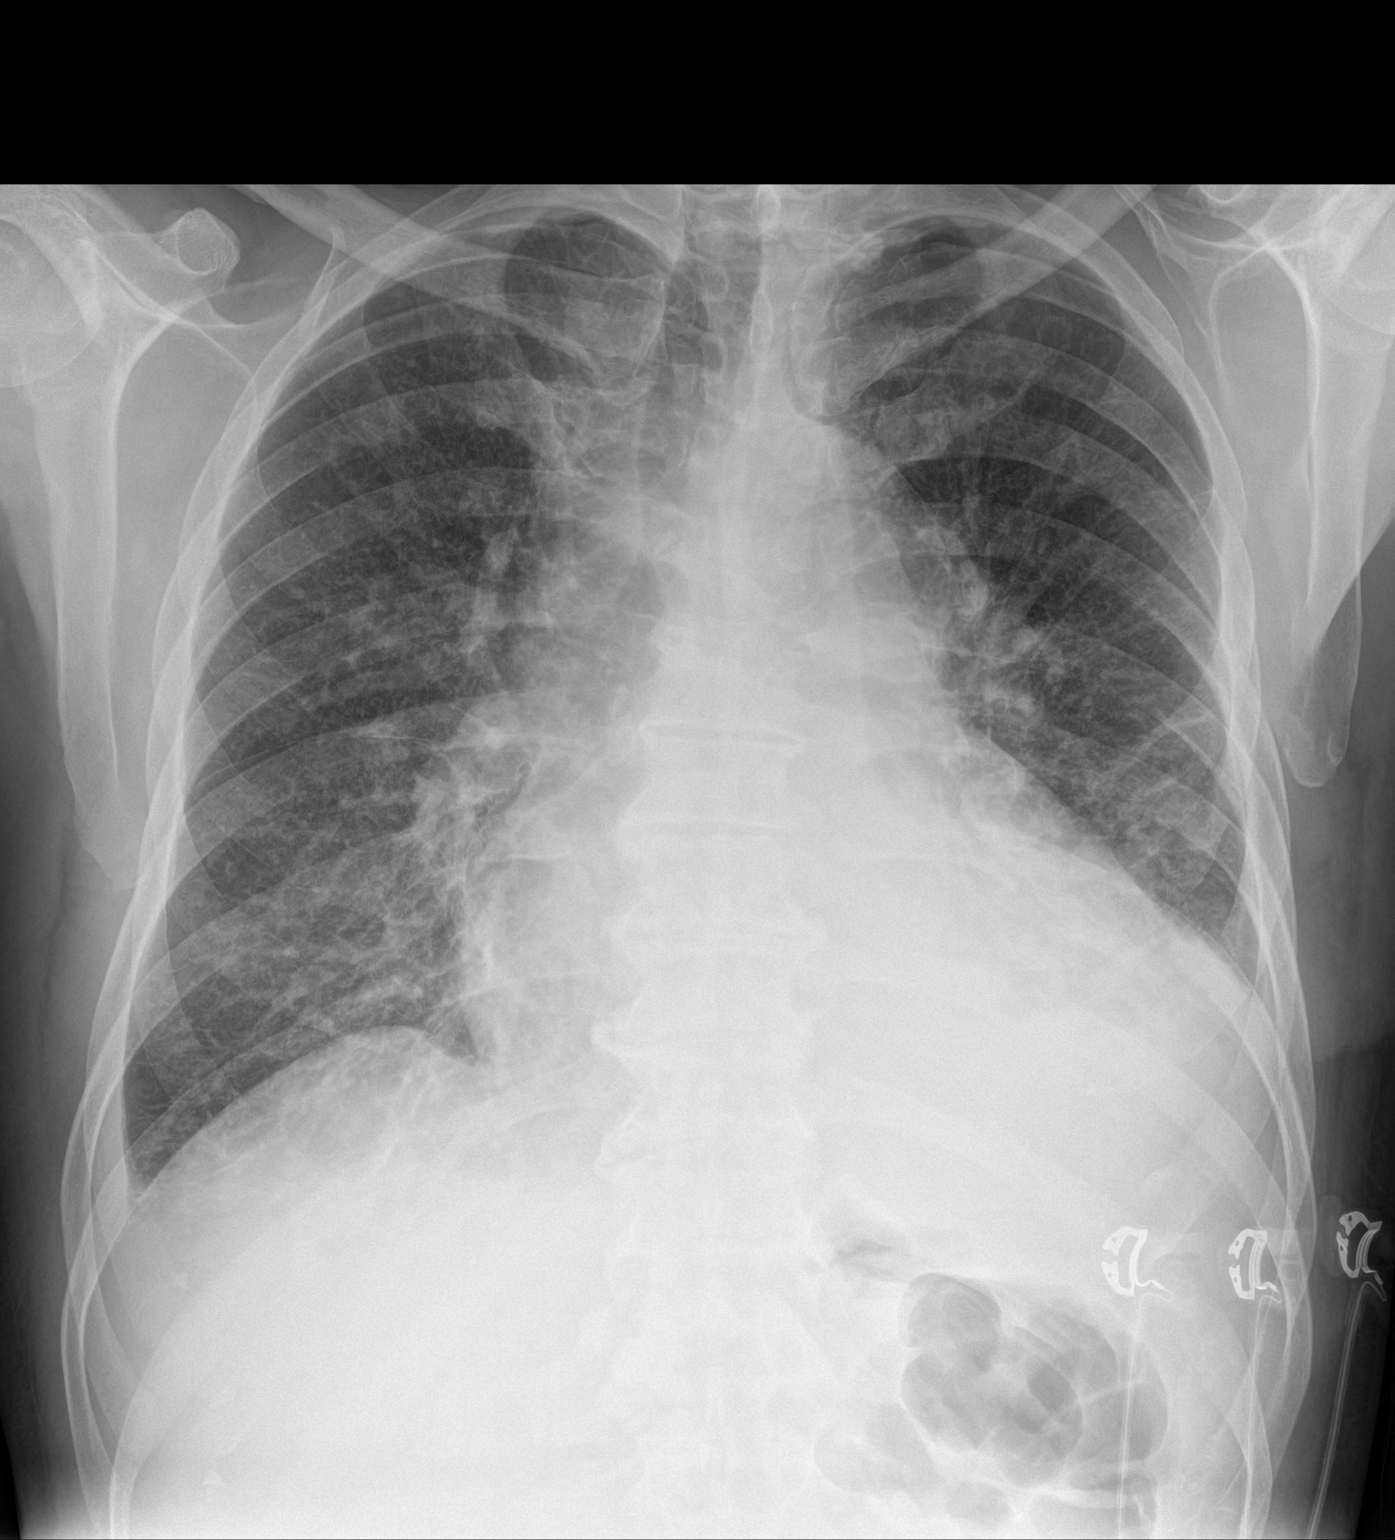

[chest lat]
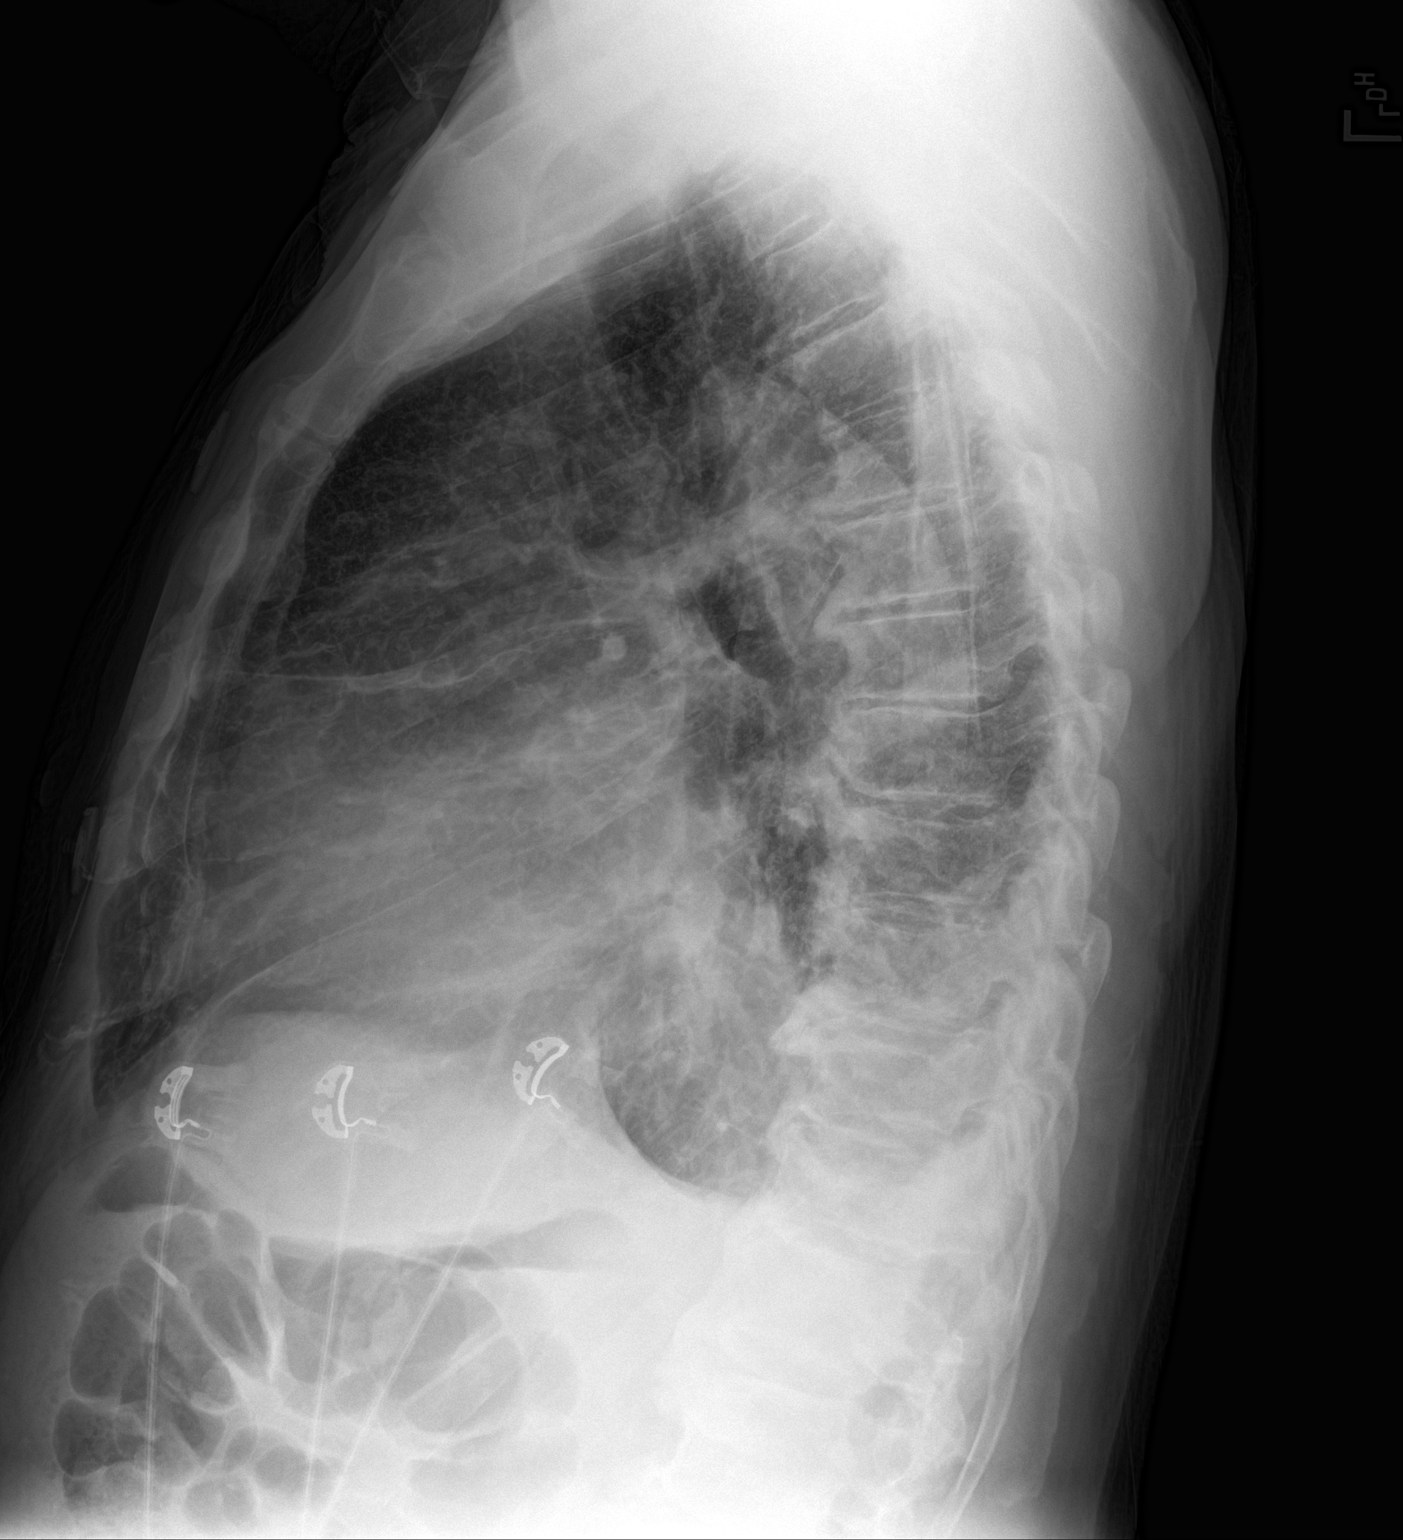

[2 of 2 positions shown; findings below may reference images not displayed]

FINDINGS: Cardiomegaly. Mildly tortuous thoracic aorta. Other mediastinal
contours are within normal limits. Visualized tracheal air column is
within normal limits. Small left greater than right pleural
effusions re- demonstrated. Increased interstitial markings in the
chest, basilar predominant. No pneumothorax. No consolidation.
Widespread bulky spinal endplate spurring. No acute osseous
abnormality identified.
IMPRESSION: Cardiomegaly with small left greater than right pleural effusions
and increased interstitial opacity suspicious for mild interstitial
edema in this setting. Chronic interstitial pulmonary changes and
viral/atypical infection are the main differential considerations.
